# Patient Record
Sex: Female | Born: 1955 | Race: White | Hispanic: No | Marital: Married | Smoking: Never smoker
Health system: Southern US, Community
[De-identification: ages and names within clinical notes are randomized; demographics above are authoritative.]

## PROBLEM LIST (undated history)

## (undated) DIAGNOSIS — K219 Gastro-esophageal reflux disease without esophagitis: Secondary | ICD-10-CM

## (undated) DIAGNOSIS — J45909 Unspecified asthma, uncomplicated: Secondary | ICD-10-CM

## (undated) DIAGNOSIS — K449 Diaphragmatic hernia without obstruction or gangrene: Secondary | ICD-10-CM

## (undated) DIAGNOSIS — N6310 Unspecified lump in the right breast, unspecified quadrant: Secondary | ICD-10-CM

## (undated) HISTORY — DX: Diaphragmatic hernia without obstruction or gangrene: K44.9

## (undated) HISTORY — PX: BREAST EXCISIONAL BIOPSY: SUR124

## (undated) HISTORY — DX: Unspecified lump in the right breast, unspecified quadrant: N63.10

## (undated) HISTORY — DX: Unspecified asthma, uncomplicated: J45.909

## (undated) HISTORY — DX: Gastro-esophageal reflux disease without esophagitis: K21.9

---

## 1987-02-12 HISTORY — PX: OTHER SURGICAL HISTORY: SHX169

## 1997-02-11 HISTORY — PX: TUBAL LIGATION: SHX77

## 1998-03-20 ENCOUNTER — Ambulatory Visit (HOSPITAL_COMMUNITY): Admission: RE | Admit: 1998-03-20 | Discharge: 1998-03-20 | Payer: Self-pay | Admitting: Gastroenterology

## 1998-04-05 ENCOUNTER — Encounter: Payer: Self-pay | Admitting: General Surgery

## 1998-04-05 ENCOUNTER — Ambulatory Visit (HOSPITAL_COMMUNITY): Admission: RE | Admit: 1998-04-05 | Discharge: 1998-04-05 | Payer: Self-pay | Admitting: General Surgery

## 1998-07-26 ENCOUNTER — Encounter: Payer: Self-pay | Admitting: General Surgery

## 1998-08-03 ENCOUNTER — Observation Stay (HOSPITAL_COMMUNITY): Admission: RE | Admit: 1998-08-03 | Discharge: 1998-08-04 | Payer: Self-pay | Admitting: General Surgery

## 1998-11-21 ENCOUNTER — Encounter: Admission: RE | Admit: 1998-11-21 | Discharge: 1998-11-21 | Payer: Self-pay | Admitting: Unknown Physician Specialty

## 1999-02-27 ENCOUNTER — Encounter: Payer: Self-pay | Admitting: General Surgery

## 1999-02-27 ENCOUNTER — Encounter: Admission: RE | Admit: 1999-02-27 | Discharge: 1999-02-27 | Payer: Self-pay | Admitting: General Surgery

## 1999-05-15 ENCOUNTER — Encounter: Admission: RE | Admit: 1999-05-15 | Discharge: 1999-05-15 | Payer: Self-pay | Admitting: Unknown Physician Specialty

## 1999-05-15 ENCOUNTER — Encounter: Payer: Self-pay | Admitting: Unknown Physician Specialty

## 1999-08-14 ENCOUNTER — Encounter: Payer: Self-pay | Admitting: General Surgery

## 1999-08-14 ENCOUNTER — Encounter: Admission: RE | Admit: 1999-08-14 | Discharge: 1999-08-14 | Payer: Self-pay | Admitting: General Surgery

## 1999-12-25 ENCOUNTER — Encounter: Admission: RE | Admit: 1999-12-25 | Discharge: 1999-12-25 | Payer: Self-pay | Admitting: Unknown Physician Specialty

## 1999-12-25 ENCOUNTER — Encounter: Payer: Self-pay | Admitting: Unknown Physician Specialty

## 2001-09-29 ENCOUNTER — Encounter: Admission: RE | Admit: 2001-09-29 | Discharge: 2001-09-29 | Payer: Self-pay | Admitting: Unknown Physician Specialty

## 2001-09-29 ENCOUNTER — Encounter: Payer: Self-pay | Admitting: Unknown Physician Specialty

## 2003-03-22 ENCOUNTER — Encounter: Admission: RE | Admit: 2003-03-22 | Discharge: 2003-03-22 | Payer: Self-pay | Admitting: Unknown Physician Specialty

## 2004-04-10 ENCOUNTER — Encounter: Admission: RE | Admit: 2004-04-10 | Discharge: 2004-04-10 | Payer: Self-pay | Admitting: Unknown Physician Specialty

## 2005-02-11 HISTORY — PX: LAPAROSCOPIC CHOLECYSTECTOMY WITH COMMON DUCT EXPLORATION AND INTRAOP CHOLANGIOGRAM: SHX6333

## 2005-03-07 ENCOUNTER — Encounter (INDEPENDENT_AMBULATORY_CARE_PROVIDER_SITE_OTHER): Payer: Self-pay | Admitting: *Deleted

## 2005-03-07 ENCOUNTER — Ambulatory Visit (HOSPITAL_COMMUNITY): Admission: RE | Admit: 2005-03-07 | Discharge: 2005-03-07 | Payer: Self-pay | Admitting: General Surgery

## 2007-02-23 ENCOUNTER — Encounter: Admission: RE | Admit: 2007-02-23 | Discharge: 2007-02-23 | Payer: Self-pay | Admitting: Unknown Physician Specialty

## 2007-08-19 ENCOUNTER — Encounter: Admission: RE | Admit: 2007-08-19 | Discharge: 2007-08-19 | Payer: Self-pay | Admitting: Unknown Physician Specialty

## 2009-08-01 ENCOUNTER — Encounter: Admission: RE | Admit: 2009-08-01 | Discharge: 2009-08-01 | Payer: Self-pay | Admitting: Unknown Physician Specialty

## 2009-08-03 ENCOUNTER — Encounter: Admission: RE | Admit: 2009-08-03 | Discharge: 2009-08-03 | Payer: Self-pay | Admitting: Unknown Physician Specialty

## 2009-09-19 ENCOUNTER — Encounter: Admission: RE | Admit: 2009-09-19 | Discharge: 2009-09-19 | Payer: Self-pay | Admitting: General Surgery

## 2009-09-22 ENCOUNTER — Encounter: Admission: RE | Admit: 2009-09-22 | Discharge: 2009-09-22 | Payer: Self-pay | Admitting: General Surgery

## 2009-09-22 ENCOUNTER — Ambulatory Visit (HOSPITAL_BASED_OUTPATIENT_CLINIC_OR_DEPARTMENT_OTHER): Admission: RE | Admit: 2009-09-22 | Discharge: 2009-09-22 | Payer: Self-pay | Admitting: General Surgery

## 2010-04-27 LAB — BASIC METABOLIC PANEL
BUN: 13 mg/dL (ref 6–23)
Chloride: 105 mEq/L (ref 96–112)
GFR calc non Af Amer: 48 mL/min — ABNORMAL LOW (ref >60)
Glucose, Bld: 168 mg/dL — ABNORMAL HIGH (ref 70–99)
Potassium: 3.3 mEq/L — ABNORMAL LOW (ref 3.5–5.1)
Sodium: 138 mEq/L (ref 135–145)

## 2010-04-27 LAB — DIFFERENTIAL
Eosinophils Relative: 3 % (ref 0–5)
Lymphocytes Relative: 40 % (ref 12–46)
Monocytes Absolute: 0.4 10*3/uL (ref 0.1–1.0)
Monocytes Relative: 7 % (ref 3–12)
Neutro Abs: 3 10*3/uL (ref 1.7–7.7)

## 2010-04-27 LAB — CBC
HCT: 44.4 % (ref 36.0–46.0)
Hemoglobin: 15.7 g/dL — ABNORMAL HIGH (ref 12.0–15.0)
MCV: 99.8 fL (ref 78.0–100.0)
RDW: 12.6 % (ref 11.5–15.5)
WBC: 6 10*3/uL (ref 4.0–10.5)

## 2010-06-29 NOTE — Op Note (Signed)
NAMECLAUDEEN, LEASON               ACCOUNT NO.:  [id number]   MEDICAL RECORD NO.:  [id number]          PATIENT TYPE:  AMB   LOCATION:  DAY                          FACILITY:  [facility]   PHYSICIAN:  Adolph Pollack, M.D.DATE OF BIRTH:  [birth year]   DATE OF PROCEDURE:  03/07/2005  DATE OF DISCHARGE:                                 OPERATIVE REPORT   PREOPERATIVE DIAGNOSES:  Symptomatic cholelithiasis.   POSTOPERATIVE DIAGNOSES:  Symptomatic cholelithiasis.   PROCEDURE:  Laparoscopic cholecystectomy with intraoperative cholangiogram.   SURGEON:  Adolph Pollack, M.D.   ASSISTANT:  Lorelee New, M.D.   ANESTHESIA:  General.   INDICATIONS:  Joann Mitchell is a 55 year old female who has been having  intermittent right upper quadrant pain for some time. She had passed a  kidney stone and she continued to have some pain after that. CT scan  demonstrated a gallstone. Because she was still having the pain and has  already passed the kidney stone, it was felt that she may have some biliary  colic and now she presents for laparoscopic cholecystectomy.   TECHNIQUE:  She was seen in the holding area and then brought to the  operating room, placed supine on the operating table and a general  anesthetic was administered. The abdominal wall was sterilely prepped and  draped. Marcaine solution was infiltrated in the subumbilical region and a  subumbilical incision was made through the skin and subcutaneous tissue  until the fascia was identified. A small incision was made in the midline  fascia and the peritoneal cavity was entered under direct vision. A  pursestring suture of #0 Vicryl was placed around the fascial edges. A  Hassan trocar was introduced into the peritoneal cavity and a  pneumoperitoneum was created by insufflation of CO2 gas.   Next, the laparoscope was introduced and she was placed in reverse  Trendelenburg position with the right side tilted slightly upward. An 11 mm  trocar was placed through an epigastric incision into the abdominal cavity  and two 5 mm trocars were placed in the right mid lateral abdomen into the  abdominal cavity. The fundus of the gallbladder was grasped and there were  noted to be adhesions between the omentum and the gallbladder. These were  able to be taken down bluntly and the fundus was able to be retracted toward  the right shoulder. The infundibulum was grasped and with dissection kept on  the gallbladder, it was mobilized. I then isolated the cystic duct and  created a window around it. A clip was placed at the cystic duct gallbladder  junction and a small incision made in the cystic duct. A cholangiocath was  passed through the anterior abdominal wall and placed into the cystic duct  and a cholangiogram was performed.   Under real time fluoroscopy, dilute contrast material was injected into the  cystic duct which was of short to moderate length. The common hepatic, right  hepatic and common bile ducts  all opacified promptly and contrast drained  into the duodenum promptly without obvious evidence of obstruction. Final  report is pending the  radiologist interpretation.   The cholangiocatheter was then removed and the cystic duct was clipped three  times proximally and then divided. I then identified an anterior branch of  the cystic artery, isolated it, clipped it and divided it. The posterior  branch was also identified, clipped and divided. There is a large vein going  directly into the gallbladder which I clipped and divided. I then dissected  the gallbladder free from the liver using electrocautery and placed it an  Endopouch bag.   The gallbladder fossa was irrigated and inspected and no bleeding or bile  leak was noted. The irrigation fluid was evacuated.   The gallbladder was removed through the subumbilical port and the  subumbilical fascial defect closed under laparoscopic vision by tightening  up and tying  down the pursestring suture. The remaining trocars were removed  and the pneumoperitoneum was released. The skin incisions were closed with 4-  0 Monocryl subcuticular stitches followed by Steri-Strips and sterile  dressings.   She tolerated the procedure well without any apparent complications and was  taken to the recovery room in satisfactory condition.      Adolph Pollack, M.D.  Electronically Signed     TJR/MEDQ  D:  03/07/2005  T:  03/07/2005  Job:  [id number]   cc:   Roney Marion, MD   Prithvipal Hanspal  Fax: [phone]

## 2010-08-29 ENCOUNTER — Other Ambulatory Visit: Payer: Self-pay | Admitting: Family Medicine

## 2010-08-29 DIAGNOSIS — Z1231 Encounter for screening mammogram for malignant neoplasm of breast: Secondary | ICD-10-CM

## 2010-09-11 ENCOUNTER — Ambulatory Visit
Admission: RE | Admit: 2010-09-11 | Discharge: 2010-09-11 | Disposition: A | Discharge status: Home / Self Care | Payer: 59 | Source: Ambulatory Visit | Attending: Family Medicine | Admitting: Family Medicine

## 2010-09-11 DIAGNOSIS — Z1231 Encounter for screening mammogram for malignant neoplasm of breast: Secondary | ICD-10-CM

## 2011-08-04 IMAGING — CR DG CHEST 2V
2 series · 2 of 2 positions shown · non-contrast
Comparison: 03/05/2005

CLINICAL DATA: Preop for breast surgery.

CHEST - 2 VIEW

[w chest pa]
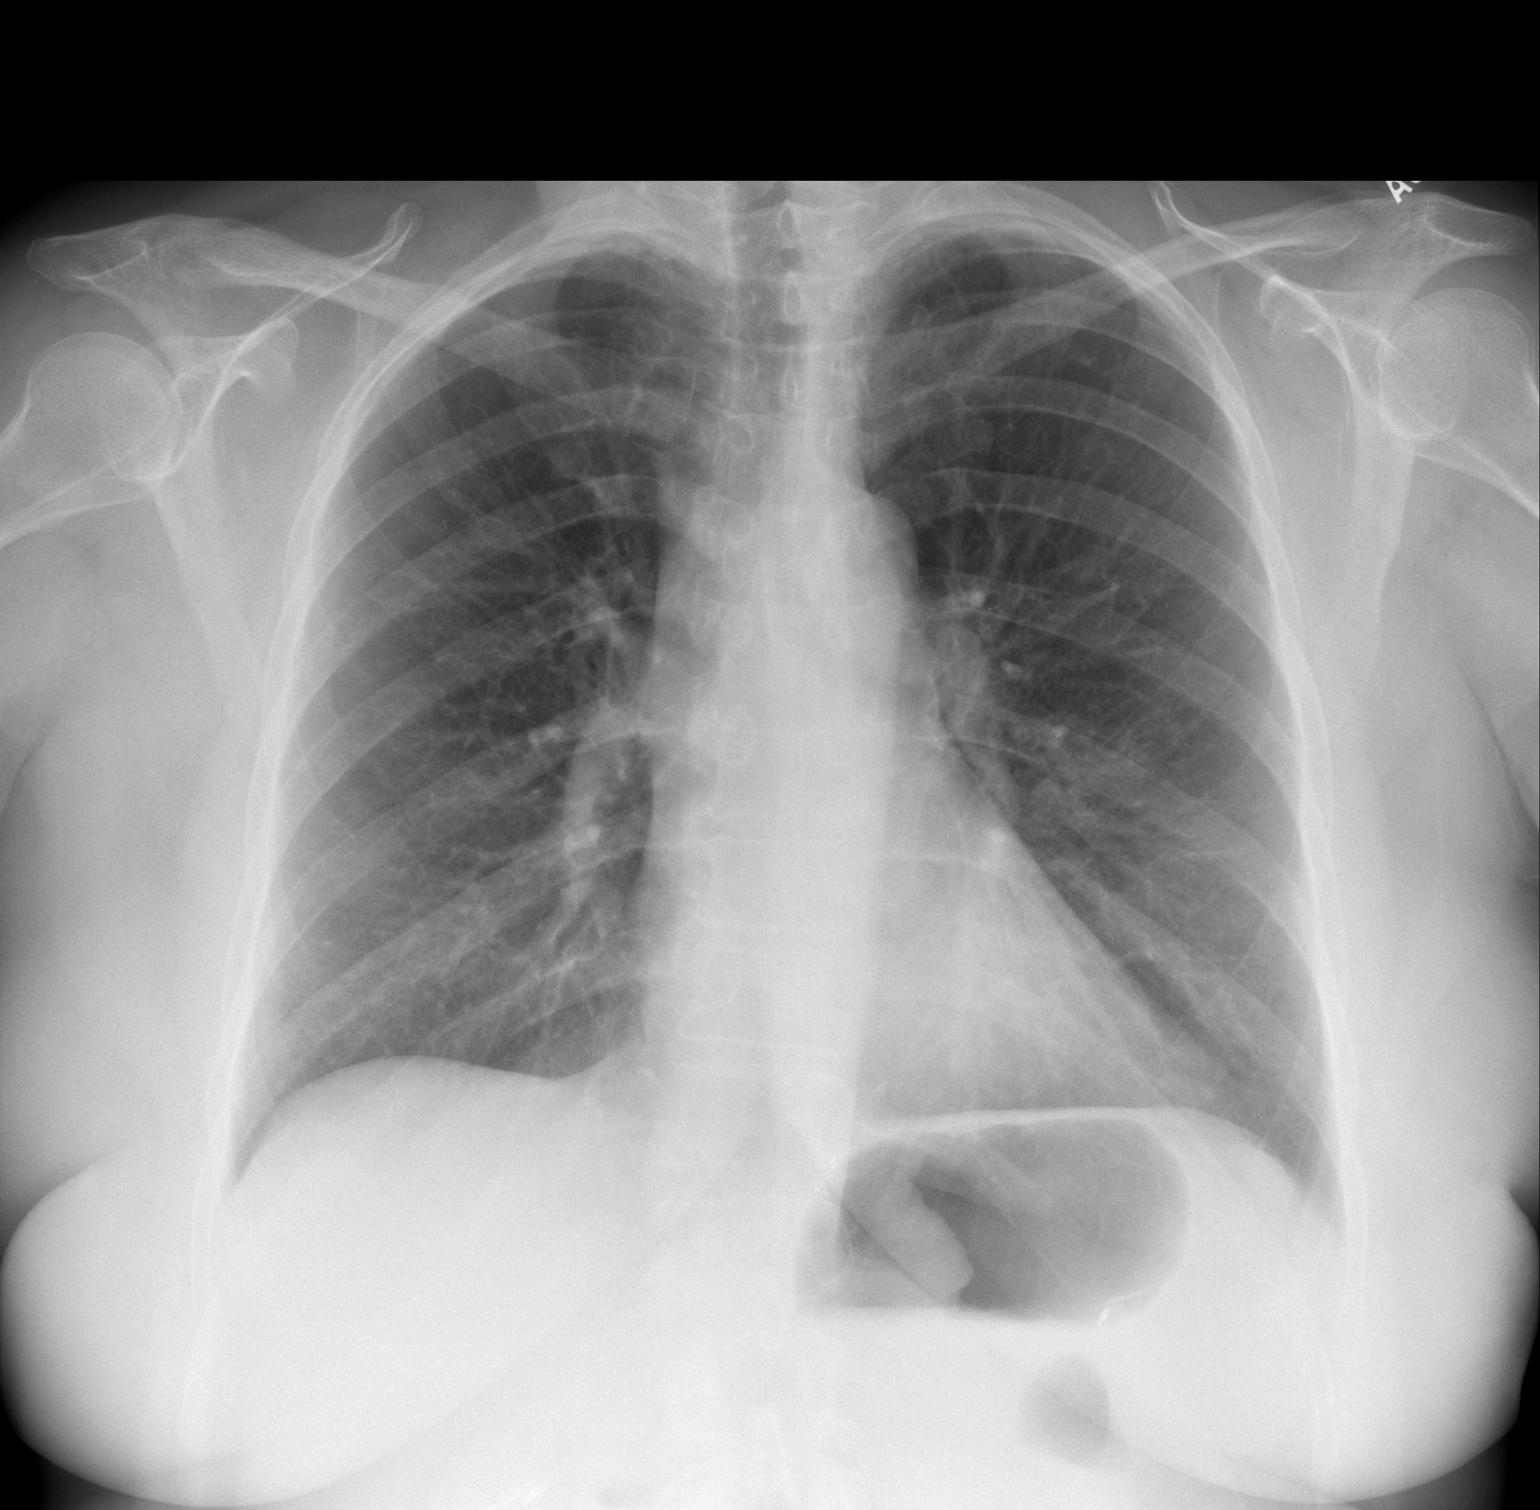

[w chest lat]
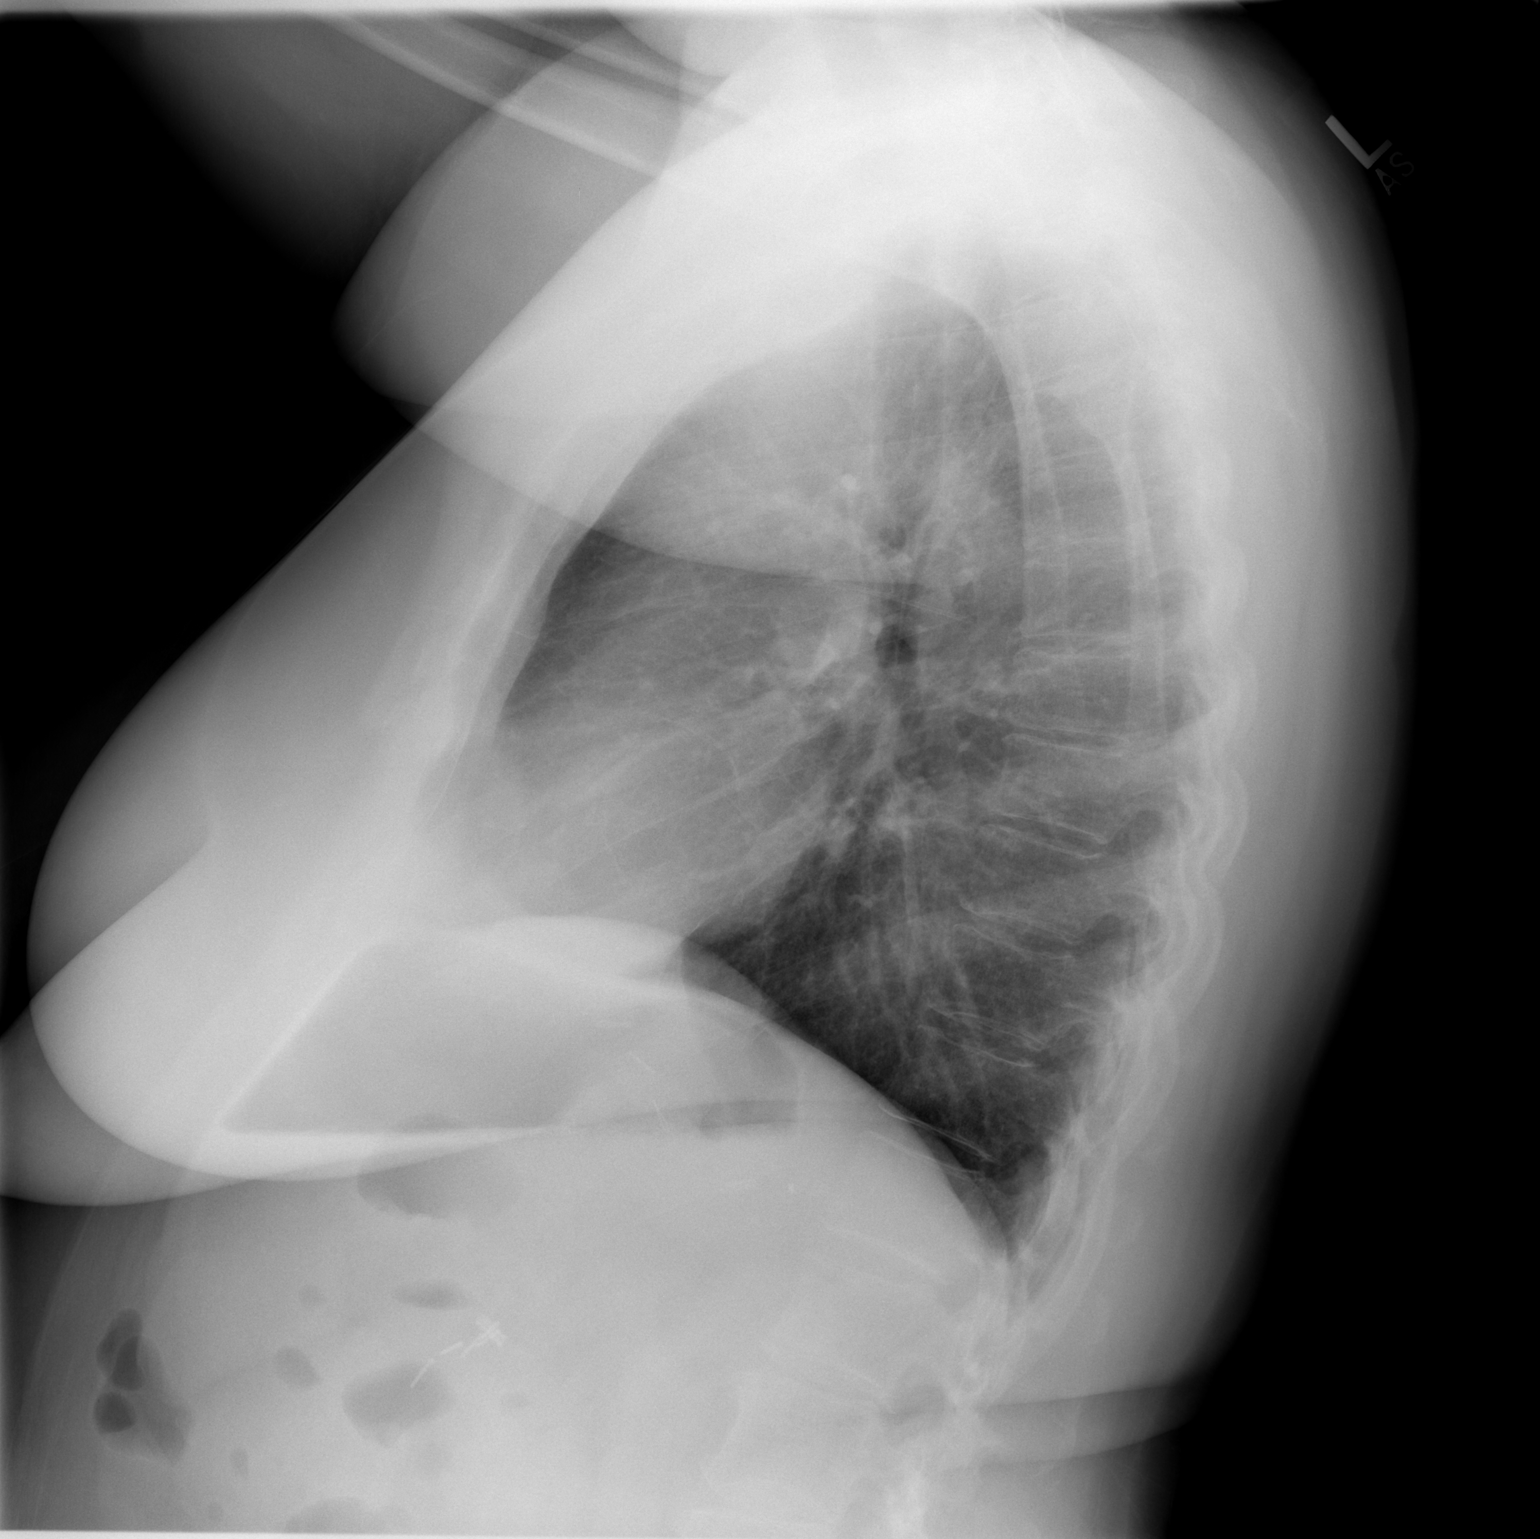

[2 of 2 positions shown; findings below may reference images not displayed]

FINDINGS: The cardiopericardial silhouette is normal in size.  The
lungs are clear.  The visualized soft tissues and bony thorax are
unremarkable.  Surgical clips are noted in the left upper quadrant.
Question splenectomy.  The visualized soft tissues and bony thorax
are otherwise unremarkable.
IMPRESSION: No acute cardiopulmonary disease or significant interval change.

## 2011-08-07 IMAGING — MG MM BREAST WIRE LOCALIZATION*R*
3 series · 3 of 3 positions shown · non-contrast
Comparison: none

September 25, 2009 –DUPLICATE COPY for exam association in [facility]. No change from original report.
CLINICAL DATA: Right breast lesion / mass.

[R CC (1 of 2)]
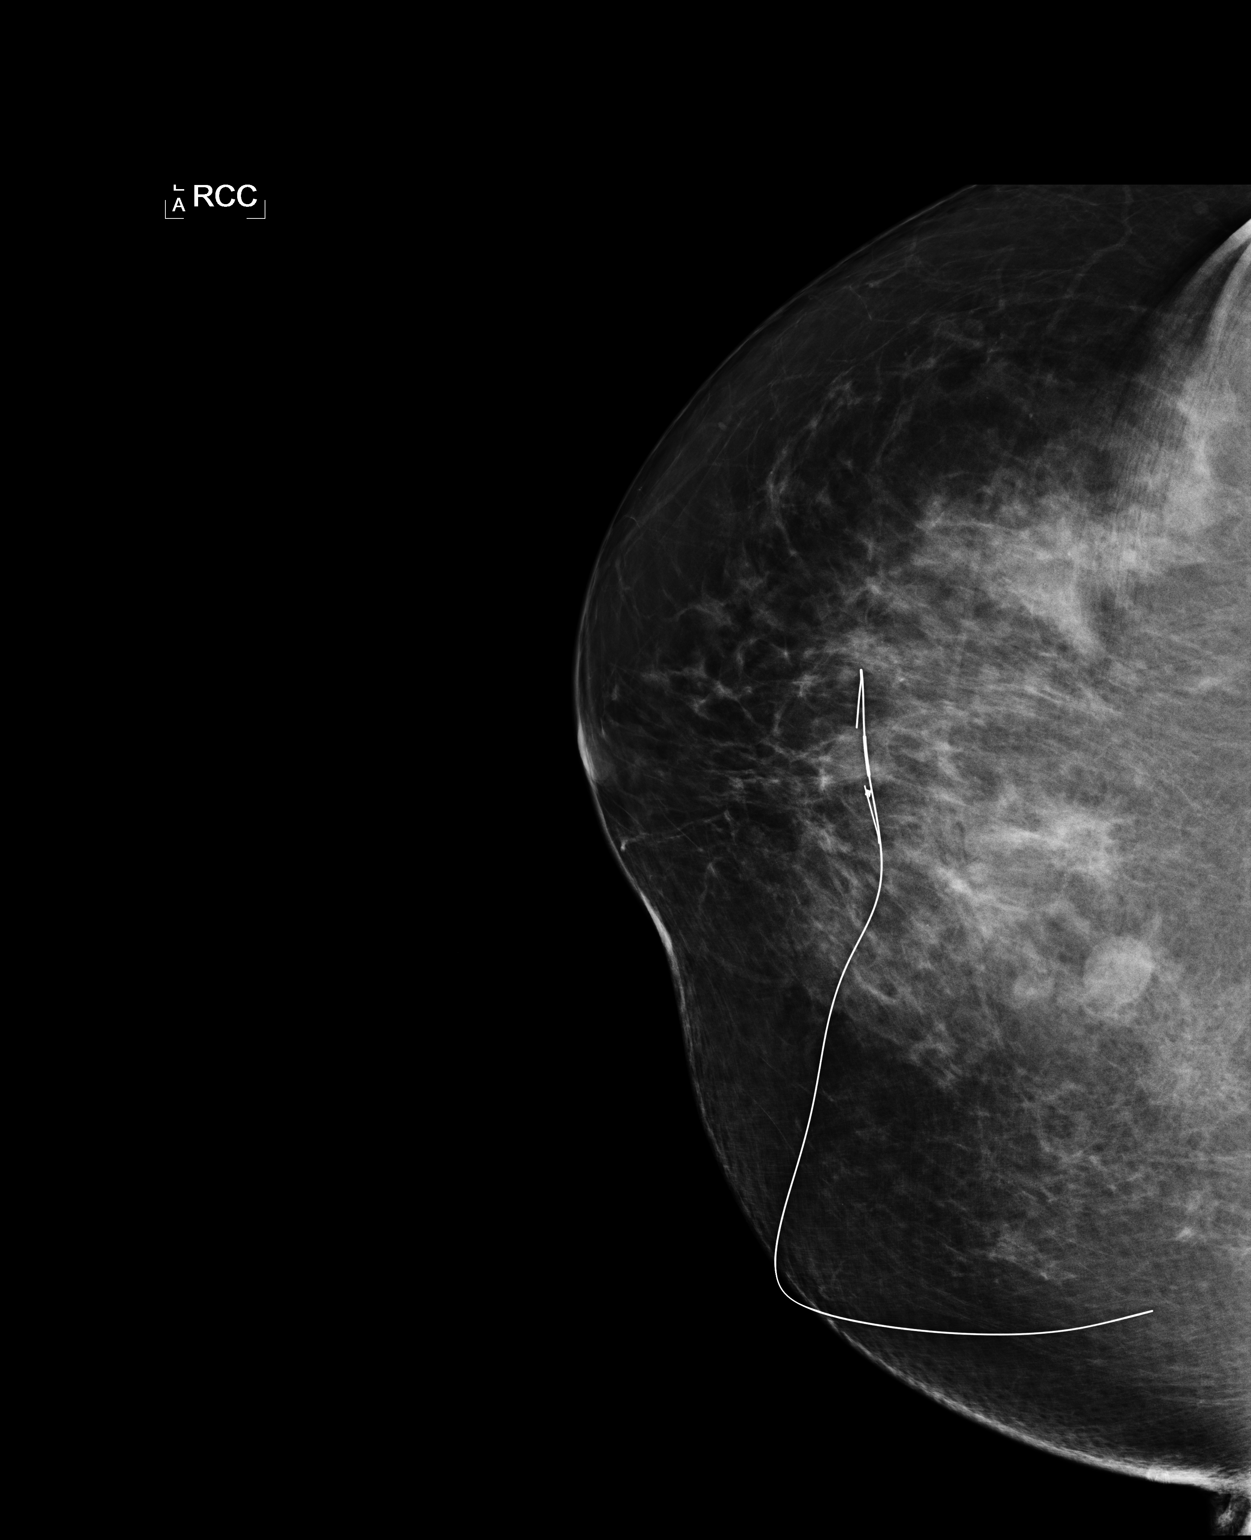

[R ML]
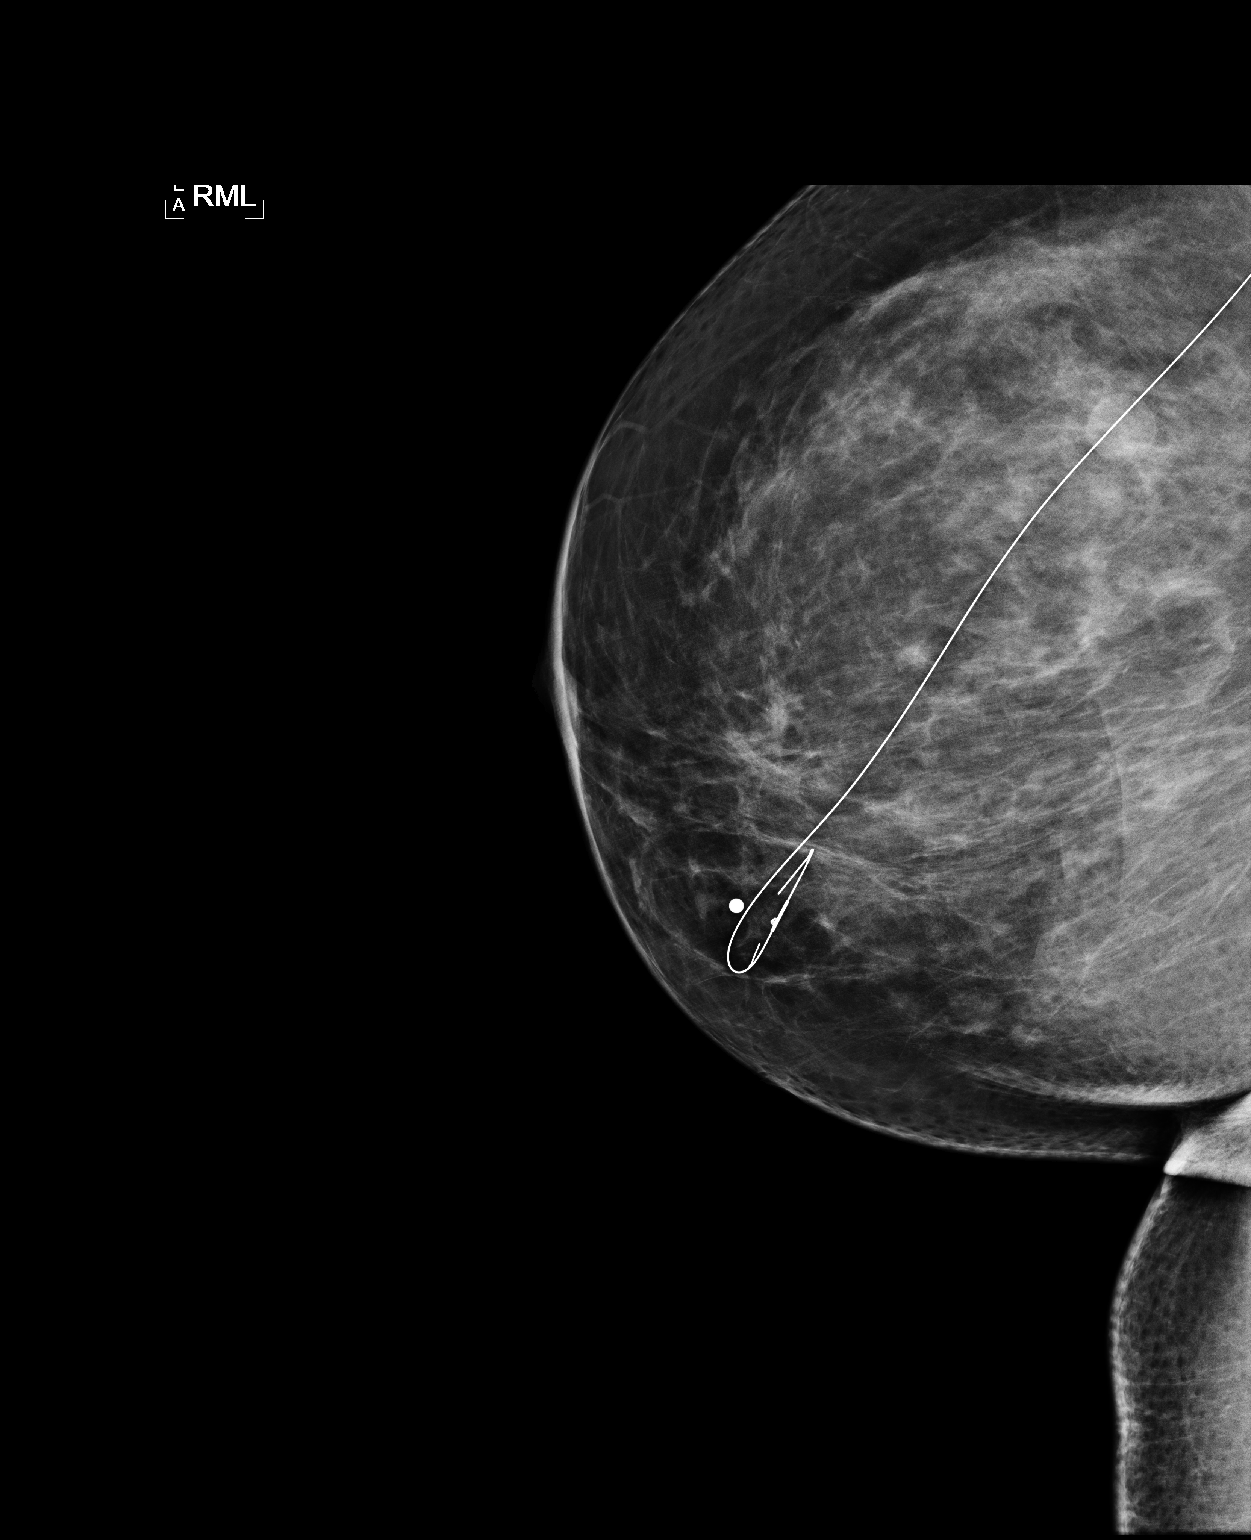

[R CC (2 of 2)]
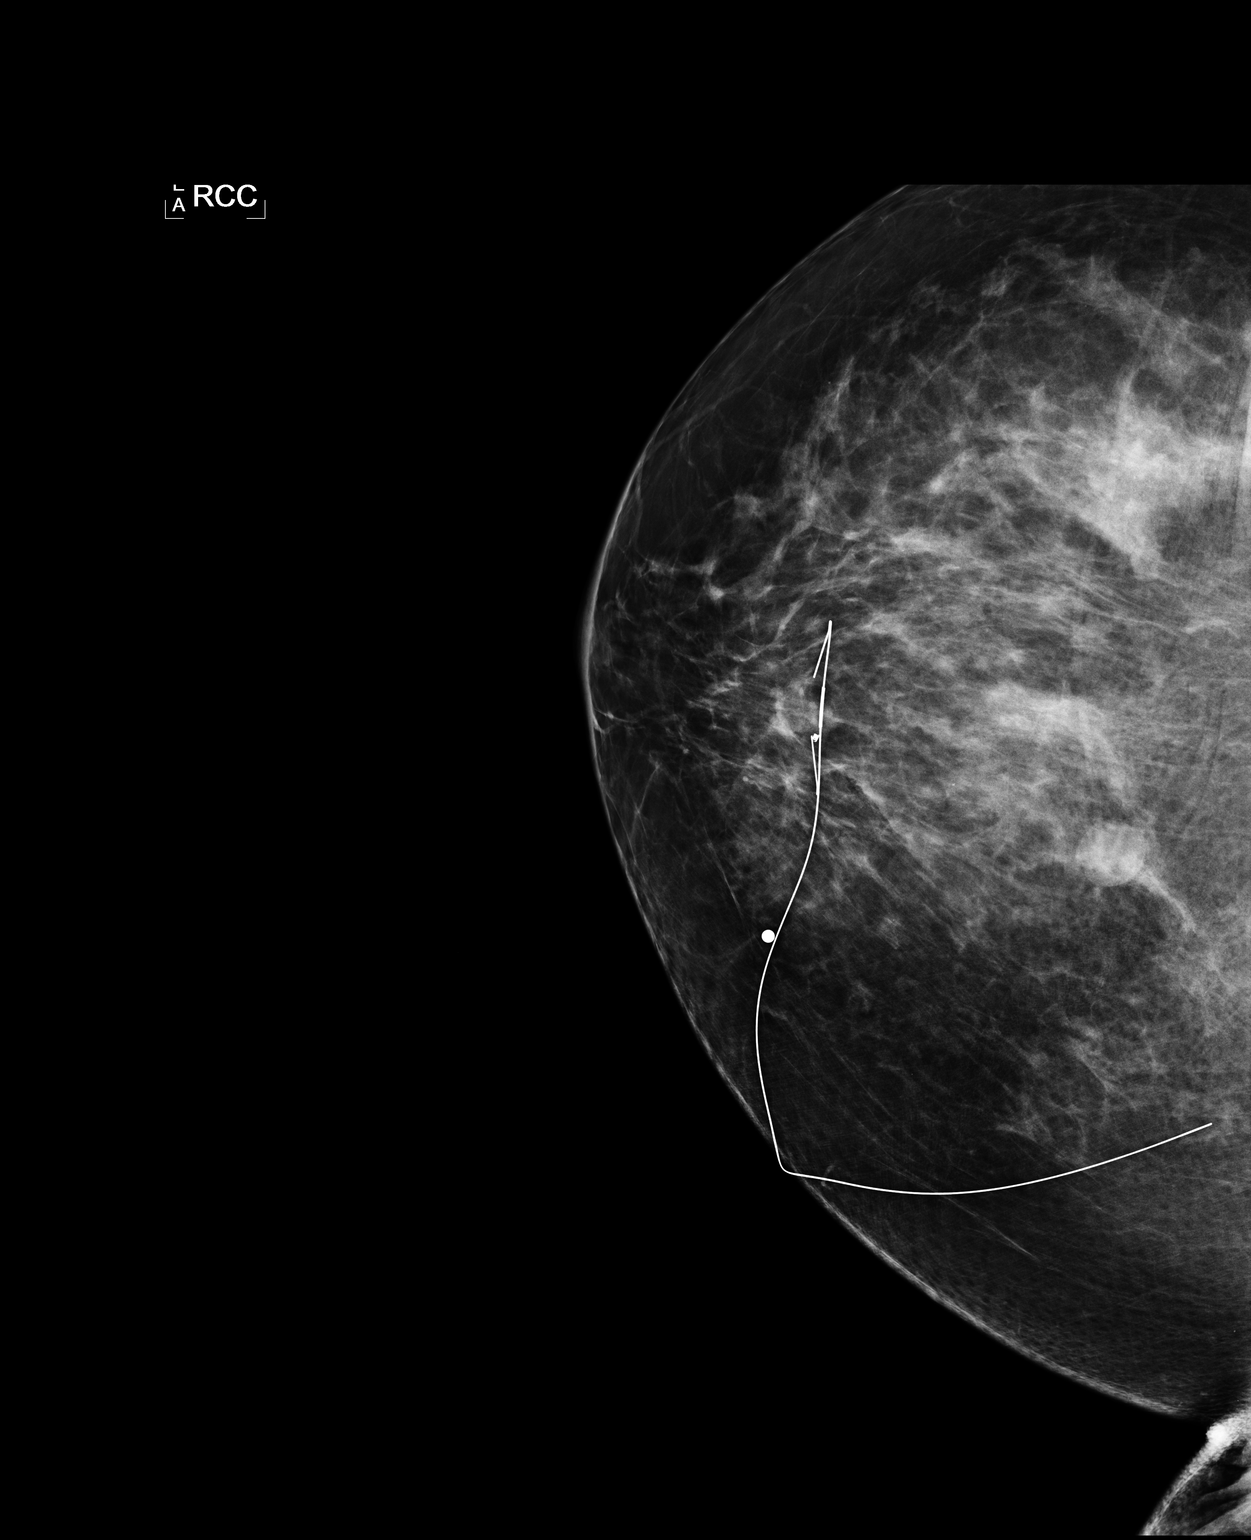

[3 of 3 positions shown; findings below may reference images not displayed]

NEEDLE LOCALIZATION USING ULTRASOUND GUIDANCE AND SPECIMEN
 RADIOGRAPH

 Patient presents for needle localization prior to excision of right
 breast mass/lesion. I met with the patient and we discussed the
 procedure of needle localization including risks, benefits, and
 alternatives. Specifically, we discussed the risks of infection,
 bleeding, tissue injury, and inadequate sampling. Informed written
 consent was given.

 Using ultrasound guidance, sterile technique, 2% lidocaine and a 7
 cm Ultra wire needle, the clip with mass was localized using a
 medial approach. The films are marked for Dr. Jipsy.

 Specimen radiograph is performed at day surgery, and confirms the
 clip to be present in the tissue sample. The specimen is marked
 for pathology.
IMPRESSION: Needle localization of the right breast. No apparent
 complications.

## 2013-05-12 ENCOUNTER — Encounter: Payer: Self-pay | Admitting: Cardiovascular Disease

## 2013-05-12 ENCOUNTER — Ambulatory Visit (INDEPENDENT_AMBULATORY_CARE_PROVIDER_SITE_OTHER): Payer: 59 | Admitting: Cardiovascular Disease

## 2013-05-12 ENCOUNTER — Encounter: Payer: Self-pay | Admitting: *Deleted

## 2013-05-12 VITALS — BP 122/82 | HR 64 | Ht 65.0 in | Wt 186.0 lb

## 2013-05-12 DIAGNOSIS — R079 Chest pain, unspecified: Secondary | ICD-10-CM

## 2013-05-12 DIAGNOSIS — E785 Hyperlipidemia, unspecified: Secondary | ICD-10-CM | POA: Insufficient documentation

## 2013-05-12 DIAGNOSIS — J45909 Unspecified asthma, uncomplicated: Secondary | ICD-10-CM | POA: Insufficient documentation

## 2013-05-12 NOTE — Progress Notes (Signed)
Patient ID: Joann Mitchell, female   DOB: 08/04/1955, 58 y.o.   MRN: [id number]   58 yo referred by urgent care in [place]  Seen there in February.  Has asthma and allergies  Had dyspnea and flair associated with chest pain  Pain was constant for a few days with pleuritic component She thought it was related to URI.  Rx with Z pack an pain went away  They recommended she have a f/u echo and ETT.  She has no risk factors has lost 40 lbs the last 2 years Elevated lipids Intolerant to crestor with myalgias and she prefers to Rx with diet No recurrent pain Some seasonal allergies    ROS: Denies fever, malais, weight loss, blurry vision, decreased visual acuity, cough, sputum, SOB, hemoptysis, pleuritic pain, palpitaitons, heartburn, abdominal pain, melena, lower extremity edema, claudication, or rash.  All other systems reviewed and negative   General: Affect appropriate Healthy:  appears stated age HEENT: normal Neck supple with no adenopathy JVP normal no bruits no thyromegaly Lungs clear with no wheezing and good diaphragmatic motion Heart:  S1/S2 no murmur,rub, gallop or click PMI normal Abdomen: benighn, BS positve, no tenderness, no AAA no bruit.  No HSM or HJR Distal pulses intact with no bruits No edema Neuro non-focal Skin warm and dry No muscular weakness  Medications Current Outpatient Prescriptions  Medication Sig Dispense Refill  . 7-Keto DHEA POWD by Does not apply route as directed.      Marland Kitchen. albuterol (PROVENTIL HFA;VENTOLIN HFA) 108 (90 BASE) MCG/ACT inhaler Inhale into the lungs every 6 (six) hours as needed for wheezing or shortness of breath.      Marland Kitchen. azithromycin (ZITHROMAX) 500 MG tablet Take by mouth as needed.      . Calcium Citrate (CITRACAL PO) Take by mouth daily.      . fexofenadine (ALLEGRA) 180 MG tablet Take 180 mg by mouth daily.      Marland Kitchen. Fexofenadine HCl (MUCINEX ALLERGY PO) Take by mouth as needed.      Marland Kitchen. MAGNESIUM PO Take by mouth daily.      . Multiple  Vitamins-Minerals (CENTRUM SILVER PO) Take by mouth daily.      . naproxen sodium (ANAPROX) 220 MG tablet Take 220 mg by mouth as needed.      . NON FORMULARY PQQ Daily       No current facility-administered medications for this visit.    Allergies Ivp dye; Relpax; Dexilant; Diphenhydramine hcl; Prednisone; and Zoloft  Family History: No family history on file.  Social History: History   Social History  . Marital Status: Married    Spouse Name: N/A    Number of Children: N/A  . Years of Education: N/A   Occupational History  . Not on file.   Social History Main Topics  . Smoking status: Never Smoker   . Smokeless tobacco: Not on file  . Alcohol Use: No  . Drug Use: No  . Sexual Activity: Not on file   Other Topics Concern  . Not on file   Social History Narrative  . No narrative on file    Electrocardiogram:  Today NSR normal ECG   Assessment and Plan

## 2013-05-12 NOTE — Patient Instructions (Signed)

## 2013-05-12 NOTE — Assessment & Plan Note (Signed)
Atypical likely related to URI  F/U ETT

## 2013-05-12 NOTE — Assessment & Plan Note (Signed)
F/U primary So long as ldl under 160 with no other risk factors should be ok Alternatively she can use red yeast rice

## 2013-05-12 NOTE — Assessment & Plan Note (Signed)
PRN inhaler, flonase and claritin as needed.  Encouraged her to seek Rx for URI early in course

## 2013-05-12 NOTE — Addendum Note (Signed)
Addended by: Scherrie BatemanYORK, CHRISTINE E on: 05/12/2013 10:47 AM   Modules accepted: Orders

## 2013-06-02 ENCOUNTER — Ambulatory Visit (HOSPITAL_COMMUNITY)
Admission: RE | Admit: 2013-06-02 | Discharge: 2013-06-02 | Disposition: A | Discharge status: Home / Self Care | Payer: 59 | Source: Ambulatory Visit | Attending: Cardiovascular Disease | Admitting: Cardiovascular Disease

## 2013-06-02 DIAGNOSIS — R079 Chest pain, unspecified: Secondary | ICD-10-CM | POA: Insufficient documentation

## 2013-06-04 ENCOUNTER — Telehealth: Payer: Self-pay | Admitting: *Deleted

## 2013-06-04 NOTE — Telephone Encounter (Signed)
Normal ETT ----- Message ----- From: Yvetta CoderPamela A Phillips Sent: 06/04/2013 7:08 AM To: Wendall StadePeter C Nishan, MD  [facility] .Zack Seal/CY

## 2013-06-04 NOTE — Telephone Encounter (Signed)
PT  AWARE OF  STRESS RESULTS./CY 

## 2013-06-04 NOTE — Telephone Encounter (Signed)
Follow Up:  Pt is returning Christine's call.

## 2013-06-09 ENCOUNTER — Telehealth: Payer: Self-pay | Admitting: Cardiovascular Disease

## 2013-06-09 NOTE — Telephone Encounter (Signed)
Follow up  ° ° ° °Patient returning call back to nurse from yesterday  °

## 2013-06-09 NOTE — Telephone Encounter (Signed)
PT  AWARE OF  GXT  RESULTS ./CY 

## 2014-03-11 ENCOUNTER — Other Ambulatory Visit: Payer: Self-pay

## 2014-03-11 DIAGNOSIS — Z1231 Encounter for screening mammogram for malignant neoplasm of breast: Secondary | ICD-10-CM

## 2014-03-22 ENCOUNTER — Encounter (INDEPENDENT_AMBULATORY_CARE_PROVIDER_SITE_OTHER): Payer: Self-pay

## 2014-03-22 ENCOUNTER — Ambulatory Visit
Admission: RE | Admit: 2014-03-22 | Discharge: 2014-03-22 | Disposition: A | Discharge status: Home / Self Care | Payer: 59 | Source: Ambulatory Visit

## 2014-03-22 DIAGNOSIS — Z1231 Encounter for screening mammogram for malignant neoplasm of breast: Secondary | ICD-10-CM

## 2015-12-18 ENCOUNTER — Other Ambulatory Visit: Payer: Self-pay | Admitting: Family Medicine

## 2015-12-18 DIAGNOSIS — Z1231 Encounter for screening mammogram for malignant neoplasm of breast: Secondary | ICD-10-CM

## 2016-01-18 ENCOUNTER — Ambulatory Visit
Admission: RE | Admit: 2016-01-18 | Discharge: 2016-01-18 | Disposition: A | Discharge status: Home / Self Care | Payer: 59 | Source: Ambulatory Visit | Attending: Family Medicine | Admitting: Family Medicine

## 2016-01-18 DIAGNOSIS — Z1231 Encounter for screening mammogram for malignant neoplasm of breast: Secondary | ICD-10-CM

## 2016-03-26 DIAGNOSIS — H01022 Squamous blepharitis right lower eyelid: Secondary | ICD-10-CM | POA: Diagnosis not present

## 2016-03-26 DIAGNOSIS — H01025 Squamous blepharitis left lower eyelid: Secondary | ICD-10-CM | POA: Diagnosis not present

## 2016-04-16 DIAGNOSIS — H01022 Squamous blepharitis right lower eyelid: Secondary | ICD-10-CM | POA: Diagnosis not present

## 2016-04-16 DIAGNOSIS — H01025 Squamous blepharitis left lower eyelid: Secondary | ICD-10-CM | POA: Diagnosis not present

## 2016-07-02 DIAGNOSIS — H04123 Dry eye syndrome of bilateral lacrimal glands: Secondary | ICD-10-CM | POA: Diagnosis not present

## 2016-07-02 DIAGNOSIS — H01022 Squamous blepharitis right lower eyelid: Secondary | ICD-10-CM | POA: Diagnosis not present

## 2016-07-02 DIAGNOSIS — H01025 Squamous blepharitis left lower eyelid: Secondary | ICD-10-CM | POA: Diagnosis not present

## 2016-09-02 DIAGNOSIS — L820 Inflamed seborrheic keratosis: Secondary | ICD-10-CM | POA: Diagnosis not present

## 2016-09-02 DIAGNOSIS — L301 Dyshidrosis [pompholyx]: Secondary | ICD-10-CM | POA: Diagnosis not present

## 2016-09-02 DIAGNOSIS — L219 Seborrheic dermatitis, unspecified: Secondary | ICD-10-CM | POA: Diagnosis not present

## 2016-09-02 DIAGNOSIS — L821 Other seborrheic keratosis: Secondary | ICD-10-CM | POA: Diagnosis not present

## 2016-09-19 DIAGNOSIS — M7701 Medial epicondylitis, right elbow: Secondary | ICD-10-CM | POA: Diagnosis not present

## 2016-10-09 DIAGNOSIS — L219 Seborrheic dermatitis, unspecified: Secondary | ICD-10-CM | POA: Diagnosis not present

## 2016-10-09 DIAGNOSIS — L820 Inflamed seborrheic keratosis: Secondary | ICD-10-CM | POA: Diagnosis not present

## 2016-11-04 DIAGNOSIS — R05 Cough: Secondary | ICD-10-CM | POA: Diagnosis not present

## 2016-11-19 DIAGNOSIS — H524 Presbyopia: Secondary | ICD-10-CM | POA: Diagnosis not present

## 2016-11-19 DIAGNOSIS — H5202 Hypermetropia, left eye: Secondary | ICD-10-CM | POA: Diagnosis not present

## 2016-11-19 DIAGNOSIS — H52223 Regular astigmatism, bilateral: Secondary | ICD-10-CM | POA: Diagnosis not present

## 2016-12-07 DIAGNOSIS — Z23 Encounter for immunization: Secondary | ICD-10-CM | POA: Diagnosis not present

## 2016-12-31 DIAGNOSIS — M7022 Olecranon bursitis, left elbow: Secondary | ICD-10-CM | POA: Diagnosis not present

## 2016-12-31 DIAGNOSIS — M25529 Pain in unspecified elbow: Secondary | ICD-10-CM | POA: Diagnosis not present

## 2016-12-31 DIAGNOSIS — M06229 Rheumatoid bursitis, unspecified elbow: Secondary | ICD-10-CM | POA: Diagnosis not present

## 2017-01-06 DIAGNOSIS — M7712 Lateral epicondylitis, left elbow: Secondary | ICD-10-CM | POA: Diagnosis not present

## 2017-01-07 DIAGNOSIS — M25522 Pain in left elbow: Secondary | ICD-10-CM | POA: Diagnosis not present

## 2017-01-07 DIAGNOSIS — M6281 Muscle weakness (generalized): Secondary | ICD-10-CM | POA: Diagnosis not present

## 2017-01-07 DIAGNOSIS — M25622 Stiffness of left elbow, not elsewhere classified: Secondary | ICD-10-CM | POA: Diagnosis not present

## 2017-01-09 DIAGNOSIS — M25622 Stiffness of left elbow, not elsewhere classified: Secondary | ICD-10-CM | POA: Diagnosis not present

## 2017-01-09 DIAGNOSIS — M25522 Pain in left elbow: Secondary | ICD-10-CM | POA: Diagnosis not present

## 2017-01-09 DIAGNOSIS — M6281 Muscle weakness (generalized): Secondary | ICD-10-CM | POA: Diagnosis not present

## 2017-01-14 DIAGNOSIS — M25622 Stiffness of left elbow, not elsewhere classified: Secondary | ICD-10-CM | POA: Diagnosis not present

## 2017-01-14 DIAGNOSIS — M25522 Pain in left elbow: Secondary | ICD-10-CM | POA: Diagnosis not present

## 2017-01-14 DIAGNOSIS — M6281 Muscle weakness (generalized): Secondary | ICD-10-CM | POA: Diagnosis not present

## 2017-01-17 DIAGNOSIS — M25622 Stiffness of left elbow, not elsewhere classified: Secondary | ICD-10-CM | POA: Diagnosis not present

## 2017-01-17 DIAGNOSIS — M6281 Muscle weakness (generalized): Secondary | ICD-10-CM | POA: Diagnosis not present

## 2017-01-17 DIAGNOSIS — M25522 Pain in left elbow: Secondary | ICD-10-CM | POA: Diagnosis not present

## 2017-01-21 DIAGNOSIS — M25522 Pain in left elbow: Secondary | ICD-10-CM | POA: Diagnosis not present

## 2017-01-21 DIAGNOSIS — M25622 Stiffness of left elbow, not elsewhere classified: Secondary | ICD-10-CM | POA: Diagnosis not present

## 2017-01-21 DIAGNOSIS — M6281 Muscle weakness (generalized): Secondary | ICD-10-CM | POA: Diagnosis not present

## 2017-01-23 DIAGNOSIS — M25522 Pain in left elbow: Secondary | ICD-10-CM | POA: Diagnosis not present

## 2017-01-23 DIAGNOSIS — M6281 Muscle weakness (generalized): Secondary | ICD-10-CM | POA: Diagnosis not present

## 2017-01-23 DIAGNOSIS — M25622 Stiffness of left elbow, not elsewhere classified: Secondary | ICD-10-CM | POA: Diagnosis not present

## 2017-01-27 DIAGNOSIS — M6281 Muscle weakness (generalized): Secondary | ICD-10-CM | POA: Diagnosis not present

## 2017-01-27 DIAGNOSIS — M25522 Pain in left elbow: Secondary | ICD-10-CM | POA: Diagnosis not present

## 2017-01-27 DIAGNOSIS — M25622 Stiffness of left elbow, not elsewhere classified: Secondary | ICD-10-CM | POA: Diagnosis not present

## 2017-02-05 DIAGNOSIS — M7712 Lateral epicondylitis, left elbow: Secondary | ICD-10-CM | POA: Diagnosis not present

## 2017-03-06 ENCOUNTER — Other Ambulatory Visit: Payer: Self-pay | Admitting: Family Medicine

## 2017-03-06 DIAGNOSIS — Z139 Encounter for screening, unspecified: Secondary | ICD-10-CM

## 2017-04-08 ENCOUNTER — Ambulatory Visit
Admission: RE | Admit: 2017-04-08 | Discharge: 2017-04-08 | Disposition: A | Discharge status: Home / Self Care | Payer: 59 | Source: Ambulatory Visit | Attending: Family Medicine | Admitting: Family Medicine

## 2017-04-08 DIAGNOSIS — Z139 Encounter for screening, unspecified: Secondary | ICD-10-CM

## 2017-04-08 DIAGNOSIS — Z1231 Encounter for screening mammogram for malignant neoplasm of breast: Secondary | ICD-10-CM | POA: Diagnosis not present

## 2017-06-29 DIAGNOSIS — S61032A Puncture wound without foreign body of left thumb without damage to nail, initial encounter: Secondary | ICD-10-CM | POA: Diagnosis not present

## 2017-10-31 DIAGNOSIS — Z23 Encounter for immunization: Secondary | ICD-10-CM | POA: Diagnosis not present

## 2017-11-14 DIAGNOSIS — Z23 Encounter for immunization: Secondary | ICD-10-CM | POA: Diagnosis not present

## 2018-03-24 ENCOUNTER — Other Ambulatory Visit: Payer: Self-pay | Admitting: Family Medicine

## 2018-03-24 DIAGNOSIS — Z1231 Encounter for screening mammogram for malignant neoplasm of breast: Secondary | ICD-10-CM

## 2018-05-12 ENCOUNTER — Ambulatory Visit: Payer: 59

## 2018-07-07 ENCOUNTER — Ambulatory Visit
Admission: RE | Admit: 2018-07-07 | Discharge: 2018-07-07 | Disposition: A | Discharge status: Home / Self Care | Payer: 59 | Source: Ambulatory Visit | Attending: Family Medicine | Admitting: Family Medicine

## 2018-07-07 ENCOUNTER — Other Ambulatory Visit: Payer: Self-pay

## 2018-07-07 DIAGNOSIS — Z1231 Encounter for screening mammogram for malignant neoplasm of breast: Secondary | ICD-10-CM

## 2020-02-07 DIAGNOSIS — H0288B Meibomian gland dysfunction left eye, upper and lower eyelids: Secondary | ICD-10-CM | POA: Diagnosis not present

## 2020-02-07 DIAGNOSIS — H0288A Meibomian gland dysfunction right eye, upper and lower eyelids: Secondary | ICD-10-CM | POA: Diagnosis not present

## 2020-02-07 DIAGNOSIS — H5203 Hypermetropia, bilateral: Secondary | ICD-10-CM | POA: Diagnosis not present

## 2020-02-07 DIAGNOSIS — H52223 Regular astigmatism, bilateral: Secondary | ICD-10-CM | POA: Diagnosis not present

## 2020-02-07 DIAGNOSIS — H43811 Vitreous degeneration, right eye: Secondary | ICD-10-CM | POA: Diagnosis not present

## 2020-12-07 ENCOUNTER — Other Ambulatory Visit: Payer: Self-pay | Admitting: Family Medicine

## 2020-12-07 DIAGNOSIS — Z1231 Encounter for screening mammogram for malignant neoplasm of breast: Secondary | ICD-10-CM

## 2021-01-16 ENCOUNTER — Ambulatory Visit
Admission: RE | Admit: 2021-01-16 | Discharge: 2021-01-16 | Disposition: A | Discharge status: Home / Self Care | Payer: Medicare Other | Source: Ambulatory Visit | Attending: Family Medicine | Admitting: Family Medicine

## 2021-01-16 DIAGNOSIS — Z1231 Encounter for screening mammogram for malignant neoplasm of breast: Secondary | ICD-10-CM

## 2022-02-21 ENCOUNTER — Other Ambulatory Visit: Payer: Self-pay | Admitting: Family Medicine

## 2022-02-21 DIAGNOSIS — Z1231 Encounter for screening mammogram for malignant neoplasm of breast: Secondary | ICD-10-CM

## 2022-05-07 ENCOUNTER — Ambulatory Visit
Admission: RE | Admit: 2022-05-07 | Discharge: 2022-05-07 | Disposition: A | Discharge status: Home / Self Care | Payer: Medicare Other | Source: Ambulatory Visit | Attending: Family Medicine | Admitting: Family Medicine

## 2022-05-07 DIAGNOSIS — Z1231 Encounter for screening mammogram for malignant neoplasm of breast: Secondary | ICD-10-CM

## 2023-03-03 ENCOUNTER — Encounter (HOSPITAL_BASED_OUTPATIENT_CLINIC_OR_DEPARTMENT_OTHER): Payer: Self-pay | Admitting: Emergency Medicine

## 2023-03-03 ENCOUNTER — Ambulatory Visit (HOSPITAL_BASED_OUTPATIENT_CLINIC_OR_DEPARTMENT_OTHER)
Admission: EM | Admit: 2023-03-03 | Discharge: 2023-03-03 | Disposition: A | Discharge status: Home / Self Care | Payer: Medicare Other | Attending: Family Medicine | Admitting: Family Medicine

## 2023-03-03 ENCOUNTER — Other Ambulatory Visit (HOSPITAL_BASED_OUTPATIENT_CLINIC_OR_DEPARTMENT_OTHER): Payer: Self-pay

## 2023-03-03 DIAGNOSIS — R051 Acute cough: Secondary | ICD-10-CM | POA: Diagnosis not present

## 2023-03-03 DIAGNOSIS — R112 Nausea with vomiting, unspecified: Secondary | ICD-10-CM

## 2023-03-03 LAB — POC COVID19/FLU A&B COMBO
Covid Antigen, POC: NEGATIVE
Influenza A Antigen, POC: NEGATIVE
Influenza B Antigen, POC: NEGATIVE

## 2023-03-03 MED ORDER — PROMETHAZINE HCL 12.5 MG PO TABS
12.5000 mg | ORAL_TABLET | Freq: Three times a day (TID) | ORAL | 1 refills | Status: AC | PRN
  Filled 2023-03-03: qty 24, 8d supply, fill #0
  Filled 2023-11-18: qty 24, 8d supply, fill #1

## 2023-03-03 MED ORDER — ONDANSETRON HCL 4 MG/2ML IJ SOLN
4.0000 mg | Freq: Once | INTRAMUSCULAR | Status: AC
  Administered 2023-03-03: 4 mg via INTRAMUSCULAR

## 2023-03-03 NOTE — ED Triage Notes (Signed)
Pt reports after she ate supper she started heaving and had diarrhea.

## 2023-03-03 NOTE — Discharge Instructions (Addendum)
Influenza and COVID are negative.  Exam is most consistent with a viral intestinal infection.  Provided information on how to manage nausea and vomiting.  Given Zofran, 4 mg, injection in the office.  Provided promethazine, 12.5 mg, every 6-8 hours, as needed for nausea and vomiting.  If unable to keep the pills down may switch to suppositories.  Provided information about rehydration.  Reviewed signs and symptoms of worsening condition that would indicate a need to return here or go to an emergency room.  Follow-up if symptoms do not improve, worsen or new symptoms occur.

## 2023-03-03 NOTE — ED Provider Notes (Signed)
[facility]    CSN: [id number] Arrival date & time: 03/03/23  0944      History   Chief Complaint Chief Complaint  Patient presents with   Nausea   Diarrhea    HPI Joann Mitchell is a 68 y.o. female.   Here with report of acute onset of some mild diarrhea, nausea and vomiting last night.  She reports that she started having nausea right after eating her evening meal last night.  Her husband ate the same food and was not sick.  She has had hiatal hernia repair and reports that her esophagitis is so tight that she can rarely vomit.  She just mostly has heaves.  She has been afraid to drink fluids due to concern about more vomiting.  She did not have medicine at home.  In the past promethazine suppositories have worked the best for her when she had nausea and vomiting.  She has had a mild cough.  Denies fever, constipation.   Diarrhea Associated symptoms: abdominal pain and vomiting   Associated symptoms: no arthralgias, no chills and no fever     Past Medical History:  Diagnosis Date   Asthma    GERD (gastroesophageal reflux disease)    Hiatal hernia    Mass of right breast    nonpalpable mass    Patient Active Problem List   Diagnosis Date Noted   Chest pain 05/12/2013   Elevated lipids 05/12/2013   Asthma 05/12/2013    Past Surgical History:  Procedure Laterality Date   BREAST EXCISIONAL BIOPSY Right 1991, 09/22/09   excision of nonpalpable right breast mass after needle localization.   cervical laser surgery  1989   LAPAROSCOPIC CHOLECYSTECTOMY WITH COMMON DUCT EXPLORATION AND INTRAOP CHOLANGIOGRAM  2007   TUBAL LIGATION  1999    OB History   No obstetric history on file.      Home Medications    Prior to Admission medications   Medication Sig Start Date End Date Taking? Authorizing Provider  promethazine (PHENERGAN) 12.5 MG tablet Take 1 tablet (12.5 mg total) by mouth every 8 (eight) hours as needed for nausea or vomiting. 03/03/23  Yes  Prescilla Sours, FNP  7-Keto DHEA POWD by Does not apply route as directed.    [provider]  albuterol (PROVENTIL HFA;VENTOLIN HFA) 108 (90 BASE) MCG/ACT inhaler Inhale into the lungs every 6 (six) hours as needed for wheezing or shortness of breath.    [provider]  azithromycin (ZITHROMAX) 500 MG tablet Take by mouth as needed.    [provider]  Calcium Citrate (CITRACAL PO) Take by mouth daily.    [provider]  fexofenadine (ALLEGRA) 180 MG tablet Take 180 mg by mouth daily.    [provider]  Fexofenadine HCl (MUCINEX ALLERGY PO) Take by mouth as needed.    [provider]  MAGNESIUM PO Take by mouth daily.    [provider]  Multiple Vitamins-Minerals (CENTRUM SILVER PO) Take by mouth daily.    [provider]  naproxen sodium (ANAPROX) 220 MG tablet Take 220 mg by mouth as needed.    [provider]  NON FORMULARY PQQ Daily    [provider]    Family History History reviewed. No pertinent family history.  Social History Social History   Tobacco Use   Smoking status: Never  Substance Use Topics   Alcohol use: No   Drug use: No     Allergies   Ivp dye [  iodinated contrast media], Relpax [eletriptan], Dexilant [dexlansoprazole], Diphenhydramine hcl, Prednisone, and Zoloft [sertraline]   Review of Systems Review of Systems  Constitutional:  Negative for chills and fever.  HENT:  Negative for ear pain and sore throat.   Eyes:  Negative for pain and visual disturbance.  Respiratory:  Positive for cough. Negative for shortness of breath.   Cardiovascular:  Negative for chest pain and palpitations.  Gastrointestinal:  Positive for abdominal pain, diarrhea, nausea and vomiting. Negative for constipation.  Genitourinary:  Negative for dysuria and hematuria.  Musculoskeletal:  Negative for arthralgias and back pain.  Skin:  Negative for color change and rash.  Neurological:   Negative for seizures and syncope.  All other systems reviewed and are negative.    Physical Exam Triage Vital Signs ED Triage Vitals  Encounter Vitals Group     BP 03/03/23 1000 (!) 91/56     Systolic BP Percentile --      Diastolic BP Percentile --      Pulse Rate 03/03/23 1000 (!) 105     Resp 03/03/23 1000 20     Temp 03/03/23 1000 98.2 F (36.8 C)     Temp Source 03/03/23 1000 Oral     SpO2 03/03/23 1000 94 %     Weight --      Height --      Head Circumference --      Peak Flow --      Pain Score 03/03/23 0959 0     Pain Loc --      Pain Education --      Exclude from Growth Chart --    No data found.  Updated Vital Signs BP (!) 91/56 (BP Location: Right Arm)   Pulse (!) 105   Temp 98.2 F (36.8 C) (Oral)   Resp 20   SpO2 94%   Visual Acuity Right Eye Distance:   Left Eye Distance:   Bilateral Distance:    Right Eye Near:   Left Eye Near:    Bilateral Near:     Physical Exam Vitals and nursing note reviewed.  Constitutional:      General: She is not in acute distress.    Appearance: She is well-developed. She is obese. She is ill-appearing. She is not toxic-appearing.  HENT:     Head: Normocephalic and atraumatic.     Right Ear: Hearing, tympanic membrane, ear canal and external ear normal.     Left Ear: Hearing, tympanic membrane, ear canal and external ear normal.     Nose: Congestion and rhinorrhea present. Rhinorrhea is clear.     Right Sinus: No maxillary sinus tenderness or frontal sinus tenderness.     Left Sinus: No maxillary sinus tenderness or frontal sinus tenderness.     Mouth/Throat:     Lips: Pink.     Mouth: Mucous membranes are moist.     Pharynx: Uvula midline. No oropharyngeal exudate or posterior oropharyngeal erythema.     Tonsils: No tonsillar exudate.  Eyes:     Conjunctiva/sclera: Conjunctivae normal.     Pupils: Pupils are equal, round, and reactive to light.  Cardiovascular:     Rate and Rhythm: Normal rate and regular  rhythm.     Heart sounds: S1 normal. No murmur heard. Pulmonary:     Effort: Pulmonary effort is normal. No respiratory distress.     Breath sounds: Normal breath sounds. No decreased breath sounds, wheezing, rhonchi or rales.  Abdominal:     Palpations: Abdomen  is soft.     Tenderness: There is generalized abdominal tenderness (Mild).  Musculoskeletal:        General: No swelling.     Cervical back: Neck supple.  Lymphadenopathy:     Head:     Right side of head: No submental, submandibular, tonsillar, preauricular or posterior auricular adenopathy.     Left side of head: No submental, submandibular, tonsillar, preauricular or posterior auricular adenopathy.     Cervical: No cervical adenopathy.     Right cervical: No superficial cervical adenopathy.    Left cervical: No superficial cervical adenopathy.  Skin:    General: Skin is warm and dry.     Capillary Refill: Capillary refill takes less than 2 seconds.     Findings: No rash.  Neurological:     Mental Status: She is alert and oriented to person, place, and time.  Psychiatric:        Mood and Affect: Mood normal.      UC Treatments / Results  Labs (all labs ordered are listed, but only abnormal results are displayed) Labs Reviewed  POC COVID19/FLU A&B COMBO - Normal    EKG   Radiology No results found.  Procedures Procedures (including critical care time)  Medications Ordered in UC Medications  ondansetron (ZOFRAN) injection 4 mg (4 mg Intramuscular Given 03/03/23 1031)    Initial Impression / Assessment and Plan / UC Course  I have reviewed the triage vital signs and the nursing notes.  Pertinent labs & imaging results that were available during my care of the patient were reviewed by me and considered in my medical decision making (see chart for details).  COVID and flu testing are negative.  Appears she has a intestinal virus.  Given ondansetron, 4 mg, IM during the visit.  Provided promethazine, 12.5  mg, 1, every 6-8 hours, as needed for nausea and vomiting.  May switch to suppositories if needed.  Provided information on rehydration.  Get plenty of fluids.  Get plenty of rest.  Follow-up if symptoms do not improve, worsen or new symptoms occur.  Final Clinical Impressions(s) / UC Diagnoses   Final diagnoses:  Acute cough  Nausea and vomiting, unspecified vomiting type     Discharge Instructions      Influenza and COVID are negative.  Exam is most consistent with a viral intestinal infection.  Provided information on how to manage nausea and vomiting.  Given Zofran, 4 mg, injection in the office.  Provided promethazine, 12.5 mg, every 6-8 hours, as needed for nausea and vomiting.  If unable to keep the pills down may switch to suppositories.  Provided information about rehydration.  Reviewed signs and symptoms of worsening condition that would indicate a need to return here or go to an emergency room.  Follow-up if symptoms do not improve, worsen or new symptoms occur.     ED Prescriptions     Medication Sig Dispense Auth. Provider   promethazine (PHENERGAN) 12.5 MG tablet Take 1 tablet (12.5 mg total) by mouth every 8 (eight) hours as needed for nausea or vomiting. 24 tablet Prescilla Sours, FNP      PDMP not reviewed this encounter.   Prescilla Sours, FNP 03/03/23 1101

## 2023-11-18 ENCOUNTER — Other Ambulatory Visit (HOSPITAL_BASED_OUTPATIENT_CLINIC_OR_DEPARTMENT_OTHER): Payer: Self-pay

## 2023-12-03 ENCOUNTER — Other Ambulatory Visit (HOSPITAL_BASED_OUTPATIENT_CLINIC_OR_DEPARTMENT_OTHER): Payer: Self-pay

## 2023-12-03 MED ORDER — FLUZONE HIGH-DOSE 0.5 ML IM SUSY
0.5000 mL | PREFILLED_SYRINGE | Freq: Once | INTRAMUSCULAR | 0 refills | Status: AC
  Filled 2023-12-03: qty 0.5, 1d supply, fill #0

## 2024-01-27 ENCOUNTER — Other Ambulatory Visit (HOSPITAL_BASED_OUTPATIENT_CLINIC_OR_DEPARTMENT_OTHER): Payer: Self-pay

## 2024-01-27 MED ORDER — NEOMYCIN-POLYMYXIN-DEXAMETH 3.5-10000-0.1 OP OINT
1.0000 | TOPICAL_OINTMENT | Freq: Three times a day (TID) | OPHTHALMIC | 1 refills | Status: AC
  Filled 2024-01-27: qty 3.5, 7d supply, fill #0
